# Patient Record
Sex: Female | Born: 1985 | Race: White | Hispanic: No | Marital: Married | Smoking: Former smoker
Health system: Southern US, Community
[De-identification: ages and names within clinical notes are randomized; demographics above are authoritative.]

## PROBLEM LIST (undated history)

## (undated) DIAGNOSIS — R7303 Prediabetes: Secondary | ICD-10-CM

## (undated) DIAGNOSIS — F419 Anxiety disorder, unspecified: Secondary | ICD-10-CM

## (undated) DIAGNOSIS — G43909 Migraine, unspecified, not intractable, without status migrainosus: Secondary | ICD-10-CM

## (undated) HISTORY — PX: WISDOM TOOTH EXTRACTION: SHX21

## (undated) HISTORY — PX: ABDOMINAL SURGERY: SHX537

---

## 2013-09-06 ENCOUNTER — Emergency Department (HOSPITAL_COMMUNITY)
Admission: EM | Admit: 2013-09-06 | Discharge: 2013-09-06 | Disposition: A | Discharge status: Home / Self Care | Payer: Self-pay | Attending: Emergency Medicine | Admitting: Emergency Medicine

## 2013-09-06 ENCOUNTER — Emergency Department (HOSPITAL_COMMUNITY): Payer: Self-pay

## 2013-09-06 ENCOUNTER — Encounter (HOSPITAL_COMMUNITY): Payer: Self-pay | Admitting: Emergency Medicine

## 2013-09-06 DIAGNOSIS — R1032 Left lower quadrant pain: Secondary | ICD-10-CM | POA: Insufficient documentation

## 2013-09-06 DIAGNOSIS — R1031 Right lower quadrant pain: Secondary | ICD-10-CM | POA: Insufficient documentation

## 2013-09-06 DIAGNOSIS — R197 Diarrhea, unspecified: Secondary | ICD-10-CM | POA: Insufficient documentation

## 2013-09-06 DIAGNOSIS — R509 Fever, unspecified: Secondary | ICD-10-CM | POA: Insufficient documentation

## 2013-09-06 DIAGNOSIS — F17200 Nicotine dependence, unspecified, uncomplicated: Secondary | ICD-10-CM | POA: Insufficient documentation

## 2013-09-06 DIAGNOSIS — Z9889 Other specified postprocedural states: Secondary | ICD-10-CM | POA: Insufficient documentation

## 2013-09-06 DIAGNOSIS — N72 Inflammatory disease of cervix uteri: Secondary | ICD-10-CM | POA: Insufficient documentation

## 2013-09-06 DIAGNOSIS — Z8679 Personal history of other diseases of the circulatory system: Secondary | ICD-10-CM | POA: Insufficient documentation

## 2013-09-06 DIAGNOSIS — E669 Obesity, unspecified: Secondary | ICD-10-CM | POA: Insufficient documentation

## 2013-09-06 DIAGNOSIS — R630 Anorexia: Secondary | ICD-10-CM | POA: Insufficient documentation

## 2013-09-06 HISTORY — DX: Migraine, unspecified, not intractable, without status migrainosus: G43.909

## 2013-09-06 LAB — WET PREP, GENITAL
TRICH WET PREP: NONE SEEN
YEAST WET PREP: NONE SEEN

## 2013-09-06 LAB — COMPREHENSIVE METABOLIC PANEL
ALBUMIN: 4 g/dL (ref 3.5–5.2)
ALT: 19 U/L (ref 0–35)
ANION GAP: 13 (ref 5–15)
AST: 23 U/L (ref 0–37)
Alkaline Phosphatase: 67 U/L (ref 39–117)
BUN: 13 mg/dL (ref 6–23)
CALCIUM: 10 mg/dL (ref 8.4–10.5)
CO2: 22 mEq/L (ref 19–32)
CREATININE: 1 mg/dL (ref 0.50–1.10)
Chloride: 103 mEq/L (ref 96–112)
GFR calc Af Amer: 87 mL/min — ABNORMAL LOW (ref >90)
GFR calc non Af Amer: 75 mL/min — ABNORMAL LOW (ref >90)
Glucose, Bld: 91 mg/dL (ref 70–99)
Potassium: 4.3 mEq/L (ref 3.7–5.3)
Sodium: 138 mEq/L (ref 137–147)
TOTAL PROTEIN: 7.6 g/dL (ref 6.0–8.3)
Total Bilirubin: <0.2 mg/dL — ABNORMAL LOW (ref 0.3–1.2)

## 2013-09-06 LAB — CBC WITH DIFFERENTIAL/PLATELET
BASOS ABS: 0 10*3/uL (ref 0.0–0.1)
BASOS PCT: 0 % (ref 0–1)
EOS ABS: 0 10*3/uL (ref 0.0–0.7)
EOS PCT: 0 % (ref 0–5)
HEMATOCRIT: 39.7 % (ref 36.0–46.0)
HEMOGLOBIN: 13.3 g/dL (ref 12.0–15.0)
Lymphocytes Relative: 14 % (ref 12–46)
Lymphs Abs: 1 10*3/uL (ref 0.7–4.0)
MCH: 29.1 pg (ref 26.0–34.0)
MCHC: 33.5 g/dL (ref 30.0–36.0)
MCV: 86.9 fL (ref 78.0–100.0)
MONO ABS: 0.6 10*3/uL (ref 0.1–1.0)
MONOS PCT: 8 % (ref 3–12)
Neutro Abs: 5.4 10*3/uL (ref 1.7–7.7)
Neutrophils Relative %: 78 % — ABNORMAL HIGH (ref 43–77)
Platelets: 269 10*3/uL (ref 150–400)
RBC: 4.57 MIL/uL (ref 3.87–5.11)
RDW: 12.4 % (ref 11.5–15.5)
WBC: 7 10*3/uL (ref 4.0–10.5)

## 2013-09-06 LAB — URINALYSIS, ROUTINE W REFLEX MICROSCOPIC
BILIRUBIN URINE: NEGATIVE
Glucose, UA: NEGATIVE mg/dL
Hgb urine dipstick: NEGATIVE
Ketones, ur: NEGATIVE mg/dL
Leukocytes, UA: NEGATIVE
NITRITE: NEGATIVE
PH: 5.5 (ref 5.0–8.0)
Protein, ur: NEGATIVE mg/dL
SPECIFIC GRAVITY, URINE: 1.009 (ref 1.005–1.030)
UROBILINOGEN UA: 0.2 mg/dL (ref 0.0–1.0)

## 2013-09-06 LAB — POC URINE PREG, ED: Preg Test, Ur: NEGATIVE

## 2013-09-06 LAB — LIPASE, BLOOD: Lipase: 40 U/L (ref 11–59)

## 2013-09-06 MED ORDER — CEFTRIAXONE SODIUM 250 MG IJ SOLR
250.0000 mg | Freq: Once | INTRAMUSCULAR | Status: AC
  Administered 2013-09-06: 250 mg via INTRAMUSCULAR
  Filled 2013-09-06: qty 250

## 2013-09-06 MED ORDER — STERILE WATER FOR INJECTION IJ SOLN
INTRAMUSCULAR | Status: AC
  Administered 2013-09-06: 20:00:00
  Filled 2013-09-06: qty 10

## 2013-09-06 MED ORDER — TRAMADOL HCL 50 MG PO TABS
50.0000 mg | ORAL_TABLET | Freq: Once | ORAL | Status: AC
  Administered 2013-09-06: 50 mg via ORAL
  Filled 2013-09-06: qty 1

## 2013-09-06 MED ORDER — FENTANYL CITRATE 0.05 MG/ML IJ SOLN
100.0000 ug | Freq: Once | INTRAMUSCULAR | Status: AC
  Administered 2013-09-06: 100 ug via INTRAVENOUS
  Filled 2013-09-06: qty 2

## 2013-09-06 MED ORDER — METRONIDAZOLE 500 MG PO TABS: 500.0000 mg | ORAL_TABLET | Freq: Two times a day (BID) | ORAL | Status: DC

## 2013-09-06 MED ORDER — TRAMADOL HCL 50 MG PO TABS: 50.0000 mg | ORAL_TABLET | Freq: Four times a day (QID) | ORAL | Status: DC | PRN

## 2013-09-06 MED ORDER — SODIUM CHLORIDE 0.9 % IV BOLUS (SEPSIS)
1000.0000 mL | Freq: Once | INTRAVENOUS | Status: AC
  Administered 2013-09-06: 1000 mL via INTRAVENOUS

## 2013-09-06 MED ORDER — AZITHROMYCIN 250 MG PO TABS
1000.0000 mg | ORAL_TABLET | Freq: Once | ORAL | Status: AC
  Administered 2013-09-06: 1000 mg via ORAL
  Filled 2013-09-06: qty 4

## 2013-09-06 NOTE — ED Notes (Signed)
Pt continues to be monitored by blood pressure, pulse ox, and 5 lead. Pelvic cart set up at bedside. Pt is undressed from waist down awaiting exam.

## 2013-09-06 NOTE — Discharge Instructions (Signed)
Cervicitis Call the [facility] to get a gynecologist and to be rechecked if you're not feeling better by next week. Call the wellness Center or any of the numbers on the resource guide to get a primary care physician Cervicitis is a soreness and swelling (inflammation) of the cervix. Your cervix is located at the bottom of your uterus. It opens up to the vagina. CAUSES   Sexually transmitted infections (STIs).   Allergic reaction.   Medicines or birth control devices that are put in the vagina.   Injury to the cervix.   Bacterial infections.  RISK FACTORS You are at greater risk if you:  Have unprotected sexual intercourse.  Have sexual intercourse with many partners.  Began sexual intercourse at an early age.  Have a history of STIs. SYMPTOMS  There may be no symptoms. If symptoms occur, they may include:   Gray, white, yellow, or bad-smelling vaginal discharge.   Pain or itching of the area outside the vagina.   Painful sexual intercourse.   Lower abdominal or lower back pain, especially during intercourse.   Frequent urination.   Abnormal vaginal bleeding between periods, after sexual intercourse, or after menopause.   Pressure or a heavy feeling in the pelvis.  DIAGNOSIS  Diagnosis is made after a pelvic exam. Other tests may include:   Examination of any discharge under a microscope (wet prep).   A Pap test.  TREATMENT  Treatment will depend on the cause of cervicitis. If it is caused by an STI, both you and your partner will need to be treated. Antibiotic medicines will be given.  HOME CARE INSTRUCTIONS   Do not have sexual intercourse until your health care provider says it is okay.   Do not have sexual intercourse until your partner has been treated, if your cervicitis is caused by an STI.   Take your antibiotics as directed. Finish them even if you start to feel better.  SEEK MEDICAL CARE IF:  Your symptoms come back.    You have a fever.  MAKE SURE YOU:   Understand these instructions.  Will watch your condition.  Will get help right away if you are not doing well or get worse. Document Released: 01/10/2005 Document Revised: 01/15/2013 Document Reviewed: 07/04/2012 [facility] Patient Information 2015 [place], [place]. This information is not intended to replace advice given to you by your health care provider. Make sure you discuss any questions you have with your health care provider.  Emergency Department Resource Guide 1) Find a Doctor and Pay Out of Pocket Although you won't have to find out who is covered by your insurance plan, it is a good idea to ask around and get recommendations. You will then need to call the office and see if the doctor you have chosen will accept you as a new patient and what types of options they offer for patients who are self-pay. Some doctors offer discounts or will set up payment plans for their patients who do not have insurance, but you will need to ask so you aren't surprised when you get to your appointment.  2) Contact Your Local Health Department Not all health departments have doctors that can see patients for sick visits, but many do, so it is worth a call to see if yours does. If you don't know where your local health department is, you can check in your phone book. The CDC also has a tool to help you locate your state's health department, and many state websites  also have listings of all of their local health departments.  3) Find a [facility] [place] [facility] If your illness is not likely to be very severe or complicated, you may want to try a walk in clinic. These are popping up all over the country in pharmacies, drugstores, and shopping centers. They're usually staffed by nurse practitioners or physician assistants that have been trained to treat common illnesses and complaints. They're usually fairly quick and inexpensive. However, if you have serious medical issues or  chronic medical problems, these are probably not your best option.  No Primary Care Doctor: - Call Health Connect at  [phone] - they can help you locate a primary care doctor that  accepts your insurance, provides certain services, etc. - Physician Referral Service- [phone]  Chronic Pain Problems: Organization         Address  Phone   Notes  [facility]  [phone] Patients need to be referred by their primary care doctor.   Medication Assistance: Organization         Address  Phone   Notes  [facility] Medication [facility] [address] [place], [place] [address] [phone] --Must be a resident of [facility] -- Must have NO insurance coverage whatsoever (no Medicaid/ Medicare, etc.) -- The pt. MUST have a primary care doctor that directs their care regularly and follows them in the community   MedAssist  [phone]   [employer]  [phone]    Agencies that provide inexpensive medical care: Organization         Address  Phone   Notes  [place]  [phone]   [facility]    [phone]   [facility] [place], [place] [address] [phone]   [place] [address] [place] [phone]   Planned Parenthood    [phone]   [facility]    [phone]   [place] and [place] [address], [place] Phone:  [phone], Fax:  [phone] Hours of Operation:  9 am - 6 pm, M-F.  Also accepts Medicaid/Medicare and self-pay.  [facility] [place] for [place], [address], [place] Phone: [phone], Fax: [phone]. Hours of Operation:  8:30 am - 5:30 pm, M-F.  Also accepts Medicaid and self-pay.  [facility] [place] [address] [place] Phone: [phone]   [place], [place], [place]  [phone], Ext. [phone] Mondays & Thursdays: 7-9 AM.  First 15 patients are seen on a first come, first serve basis.    [place] Providers:  Organization         Address  Phone   Notes  [facility] [address], [place] [phone] Also accepts self-pay patients.  [facility] [address], [place]  [phone]   [place], [address], [place] [phone]   [facility] [address] [place] [phone]   Lucianne Lei [address], [place]   [phone] Only accepts [place] Access [place] patients after they have their name applied to their card.   Self-Pay (no insurance) in [facility]:  [employer]  Notes  Sickle Cell Patients, [place] [phone]   [facility] Urgent Care [place] [phone]   [facility] [place]  [place], [place], [place] [phone]   [facility]/Dr. Osei-Bonsu  [address], [place] or [address], [place] [phone] Phone number for both [place] and [place] locations is the same.  Urgent Medical and [facility] [address], [place] [phone]   [facility] [address] [place] or [address] [phone]   [facility] [address] [place] [phone], phone; [phone], fax Sees patients 1st and 3rd Saturday of every month.  Must not qualify for public or private insurance (i.e. Medicaid, Medicare, [place] Health Choice, Veterans' Benefits)  Household income should be no more than 200% of the poverty level The clinic cannot treat you if you are pregnant or think you are pregnant  Sexually transmitted  diseases are not treated at the clinic.    Dental Care: Organization         Address  Phone  Notes  [facility] Department of [facility] [place] [phone] Accepts children up to age 15 who are enrolled in [place] or [place]; pregnant women with a Medicaid card; and children who have applied for Medicaid or [place] Health Choice, but were declined, whose parents can pay a reduced fee at time of service.  [facility] Department of [facility]  [address], [place] [phone] Accepts children up to age 36 who are enrolled in [place] or [place]; pregnant women with a Medicaid card; and children who have applied for Medicaid or [place] Health Choice, but were declined, whose parents can pay a reduced fee at time of service.  [facility]  [place] [phone] Patients are seen by appointment only. Walk-ins are not accepted. [place] will see patients 55 years of age and older. Monday - Tuesday (8am-5pm) Most Wednesdays (8:30-5pm) $30 per visit, cash only  [facility]  [address], [facility] [phone] Patients are seen by appointment only. Walk-ins are not accepted. [place] will see patients 37 years of age and older. One Wednesday Evening (Monthly: Volunteer Based).  $30 per visit, cash only  [place]  [phone] for adults; Children under age 70, call Graduate Pediatric Dentistry at [phone]. Children aged 67-14, please call [phone] to request a pediatric application.  Dental services are provided in all areas of dental care including fillings, crowns and bridges, complete and partial dentures, implants, gum treatment, root canals, and extractions. Preventive care is also provided. Treatment is provided to both adults and children. Patients are selected via a  lottery and there is often a waiting list.   [facility] [address] [place]  [phone] [email or web address]   [facility] [address] [place], [place] [phone], Ext. [phone] Second and Fourth Thursday of each month, opens at 6:30 AM; Clinic ends at 9 AM.  Patients are seen on a first-come first-served basis, and a limited number are seen during each clinic.   [facility]  [address] Hillard Danker [place], [place] [phone]  Eligibility Requirements You must have lived in [place], [place], or [place] counties for at least the last three months.   You cannot be eligible for state or federal sponsored [employer], including [employer], [place], or [employer].   You generally cannot be eligible for healthcare insurance through your employer.    How to apply: Eligibility screenings are held every Tuesday and Wednesday afternoon from 1:00 pm until 4:00 pm. You do not need an appointment for the interview!  [facility] [address] [place], [place]   [place]  [place] Department  [place]  [phone]    Behavioral Health Resources in the Community: Intensive Outpatient Programs Organization         Address  Phone  Notes  [place]. [address] [place], [place] [phone]   [facility] Outpatient [address], [place], [place]   ADS: Alcohol & Drug Svcs [address] [place], [place]   [place] [address],  [place], [place] or [phone]   Substance Abuse Resources Organization         Address  Phone  Notes  Alcohol and Drug Services  [phone]   [place]  [phone]   The [place]   Chinita Pester  [phone]   Residential &  Outpatient Substance Abuse Program  [phone]   Psychological Services Organization         Address  Phone  Notes  [facility]  [place]  [phone]   [place] [address], [place] or [phone]    Mobile Crisis Teams Organization         Address  Phone  Notes  [facility], Mobile Crisis Care Unit  [phone]   [facility] Services  [address] [place], [place]   [place] [address] [place] [phone]    Self-Help/Support Groups Organization         Address  Phone             Notes  [place]. of [place] - variety of support groups  [place] Call for more information  Narcotics Anonymous (NA), [facility]aring [facility] [address], [employer] [place]  2 meetings at this location   Special educational needs teacher         Address  Phone  Notes  [facility] Residential Treatment [place],    [place]  [phone]   [facility]  [address], [place] [phone], [place], [place]   [place], [place] [phone] Admissions: 8am-3pm M-F  Incentives Substance [place] [address]    [place], [place] [phone]   [facility] [address] Jadene Pierini [place], [place]   [facility] [address]  [place], [place]   [facility]ight Programs - Intensive Outpatient [address], [place], [place]   [facility] ([place].) [place].,  [place], [place] or [phone]   [facility] Services (RTS) [address], [place], [place] Accepts Medicaid  [facility] [place] [address]  [place] [phone] Substance Abuse/Addiction Treatment   [facility] Resources Organization  Address  Phone  Notes  [facility]  [phone]   Domenic Schwab, PhD [address] Arlis Porta [place], [place]   [phone] or [phone]   [place], [place] [phone]   [address], [place], [place] [phone] Insurance/Medicaid/sponsorship through [facility] and Families [address], [address] [place]                                    [place], [place] [phone] [place] [address], [place] [phone]    Dr. Adele Schilder  [phone]   [facility] [place] Dept. 1) [address] [place] 2) [place] 3)  [place] [address], [place] [phone]  [phone]   [place] [phone] or [phone] (After Hours)

## 2013-09-06 NOTE — ED Notes (Signed)
Pt reports thinks she was pregnant and then was going to take a preg test but started to bleed; has had previous miscarriage, now this morning- severe lower back and abd pain, 100.6 temp at home, diarrhea, then started to have brown bleeding

## 2013-09-06 NOTE — ED Provider Notes (Signed)
CSN: [id number]     Arrival date & time 09/06/13  1433 History   First MD Initiated Contact with Patient 09/06/13 1626     Chief Complaint  Patient presents with  . Abdominal Pain     (Consider location/radiation/quality/duration/timing/severity/associated sxs/prior Treatment) HPI Complains of low crampy abdominal pain onset approximately 5 days ago. She developed copious vaginal bleeding onset 5 days ago which has since slowed down. Her normal menstrual cycle runs approximately every 2.5 months. She denies nausea. Maximum temperature was 100.6 today. She has had approximately 5 episodes of nonbloody diarrhea today. Pain is worse with movement improved after treating herself with Benadryl. Last treated last night. Other associated symptoms include diminished appetite. She denies urinary symptoms. She admits to brownish discharge since vaginal bleeding has slowed. Treatment with Benadryl. No other treatment prior to coming here. Pain is much improved over the past 24 hours. Patient thought she might be pregnant Past Medical History  Diagnosis Date  . Migraine    Past Surgical History  Procedure Laterality Date  . Abdominal surgery      abd mass removed   History reviewed. No pertinent family history. History  Substance Use Topics  . Smoking status: Current Every Day Smoker -- 0.25 packs/day    Types: Cigarettes  . Smokeless tobacco: Not on file  . Alcohol Use: No   OB History   Grav Para Term Preterm Abortions TAB SAB Ect Mult Living                 Review of Systems  Constitutional: Positive for fever and appetite change.  HENT: Negative.   Respiratory: Negative.   Cardiovascular: Negative.   Gastrointestinal: Positive for abdominal pain and diarrhea.  Genitourinary: Positive for vaginal bleeding and vaginal discharge.  Musculoskeletal: Negative.   Skin: Negative.   Neurological: Negative.   Psychiatric/Behavioral: Negative.   All other systems reviewed and are  negative.     Allergies  Other and Morphine and related  Home Medications   Prior to Admission medications   Not on File   BP 130/96  Pulse 108  Temp(Src) 99 F (37.2 C) (Oral)  Resp 18  Ht 5' 10.5" (1.791 m)  Wt 210 lb (95.255 kg)  BMI 29.70 kg/m2  SpO2 97%  LMP 07/02/2013 Physical Exam  Nursing note and vitals reviewed. Constitutional: She appears well-developed and well-nourished. No distress.  HENT:  Head: Normocephalic and atraumatic.  Eyes: Conjunctivae are normal. Pupils are equal, round, and reactive to light.  Neck: Neck supple. No tracheal deviation present. No thyromegaly present.  Cardiovascular: Normal rate and regular rhythm.   No murmur heard. Pulmonary/Chest: Effort normal and breath sounds normal.  Abdominal: Soft. Bowel sounds are normal. She exhibits no distension. There is no tenderness.  Obese. Mild diffuse  tenderness over bilateral lower quadrants  Genitourinary:  No external lesion. Yellowish vaginal discharge. Cervical os closed. Positive cervical motion tenderness positive bilateral adnexal tenderness no masses  Musculoskeletal: Normal range of motion. She exhibits no edema and no tenderness.  Neurological: She is alert. Coordination normal.  Skin: Skin is warm and dry. No rash noted.  Psychiatric: She has a normal mood and affect.    ED Course  Procedures (including critical care time) Labs Review Labs Reviewed  CBC WITH DIFFERENTIAL - Abnormal; Notable for the following:    Neutrophils Relative % 78 (*)    All other components within normal limits  COMPREHENSIVE METABOLIC PANEL - Abnormal; Notable for the following:    Total  Bilirubin <0.2 (*)    GFR calc non Af Amer 75 (*)    GFR calc Af Amer 87 (*)    All other components within normal limits  GC/CHLAMYDIA PROBE AMP  WET PREP, GENITAL  LIPASE, BLOOD  URINALYSIS, ROUTINE W REFLEX MICROSCOPIC  RPR  HIV ANTIBODY (ROUTINE TESTING)  POC URINE PREG, ED   Results for orders placed  during the hospital encounter of 09/06/13  WET PREP, GENITAL      Result Value Ref Range   Yeast Wet Prep HPF POC NONE SEEN  NONE SEEN   Trich, Wet Prep NONE SEEN  NONE SEEN   Clue Cells Wet Prep HPF POC MODERATE (*) NONE SEEN   WBC, Wet Prep HPF POC FEW (*) NONE SEEN  CBC WITH DIFFERENTIAL      Result Value Ref Range   WBC 7.0  4.0 - 10.5 K/uL   RBC 4.57  3.87 - 5.11 MIL/uL   Hemoglobin 13.3  12.0 - 15.0 g/dL   HCT 39.7  36.0 - 46.0 %   MCV 86.9  78.0 - 100.0 fL   MCH 29.1  26.0 - 34.0 pg   MCHC 33.5  30.0 - 36.0 g/dL   RDW 12.4  11.5 - 15.5 %   Platelets 269  150 - 400 K/uL   Neutrophils Relative % 78 (*) 43 - 77 %   Neutro Abs 5.4  1.7 - 7.7 K/uL   Lymphocytes Relative 14  12 - 46 %   Lymphs Abs 1.0  0.7 - 4.0 K/uL   Monocytes Relative 8  3 - 12 %   Monocytes Absolute 0.6  0.1 - 1.0 K/uL   Eosinophils Relative 0  0 - 5 %   Eosinophils Absolute 0.0  0.0 - 0.7 K/uL   Basophils Relative 0  0 - 1 %   Basophils Absolute 0.0  0.0 - 0.1 K/uL  COMPREHENSIVE METABOLIC PANEL      Result Value Ref Range   Sodium 138  137 - 147 mEq/L   Potassium 4.3  3.7 - 5.3 mEq/L   Chloride 103  96 - 112 mEq/L   CO2 22  19 - 32 mEq/L   Glucose, Bld 91  70 - 99 mg/dL   BUN 13  6 - 23 mg/dL   Creatinine, Ser 1.00  0.50 - 1.10 mg/dL   Calcium 10.0  8.4 - 10.5 mg/dL   Total Protein 7.6  6.0 - 8.3 g/dL   Albumin 4.0  3.5 - 5.2 g/dL   AST 23  0 - 37 U/L   ALT 19  0 - 35 U/L   Alkaline Phosphatase 67  39 - 117 U/L   Total Bilirubin <0.2 (*) 0.3 - 1.2 mg/dL   GFR calc non Af Amer 75 (*) >90 mL/min   GFR calc Af Amer 87 (*) >90 mL/min   Anion gap 13  5 - 15  LIPASE, BLOOD      Result Value Ref Range   Lipase 40  11 - 59 U/L  URINALYSIS, ROUTINE W REFLEX MICROSCOPIC      Result Value Ref Range   Color, Urine YELLOW  YELLOW   APPearance CLEAR  CLEAR   Specific Gravity, Urine 1.009  1.005 - 1.030   pH 5.5  5.0 - 8.0   Glucose, UA NEGATIVE  NEGATIVE mg/dL   Hgb urine dipstick NEGATIVE  NEGATIVE    Bilirubin Urine NEGATIVE  NEGATIVE   Ketones, ur NEGATIVE  NEGATIVE mg/dL  Protein, ur NEGATIVE  NEGATIVE mg/dL   Urobilinogen, UA 0.2  0.0 - 1.0 mg/dL   Nitrite NEGATIVE  NEGATIVE   Leukocytes, UA NEGATIVE  NEGATIVE  POC URINE PREG, ED      Result Value Ref Range   Preg Test, Ur NEGATIVE  NEGATIVE   US Transvaginal Non-ob  09/06/2013   CLINICAL DATA:  Pelvic pain and bilateral adnexal tenderness.  EXAM: TRANSABDOMINAL AND TRANSVAGINAL ULTRASOUND OF PELVIS  TECHNIQUE: Both transabdominal and transvaginal ultrasound examinations of the pelvis were performed. Transabdominal technique was performed for global imaging of the pelvis including uterus, ovaries, adnexal regions, and pelvic cul-de-sac. It was necessary to proceed with endovaginal exam following the transabdominal exam to visualize the uterus, endometrium, ovaries and adnexal regions.  COMPARISON:  None.  FINDINGS: Uterus  Measurements: 5.4 x 2.9 x 3.7 cm. No fibroids or other mass visualized.  Endometrium  Thickness: 3 mm.  No focal abnormality visualized.  Right ovary  Measurements: 4.0 x 2.9 x 3.6 cm. Probable hemorrhagic follicle measures 1.9 cm.  Left ovary  Measurements: 3.5 x 2.4 x 2.7 cm. Normal appearance/no adnexal mass.  Other findings  No free fluid.  IMPRESSION: No acute findings to explain the patient's pain.   Electronically Signed   By: Lorin Picket M.D.   On: 09/06/2013 18:41   US Pelvis Complete  09/06/2013   CLINICAL DATA:  Pelvic pain and bilateral adnexal tenderness.  EXAM: TRANSABDOMINAL AND TRANSVAGINAL ULTRASOUND OF PELVIS  TECHNIQUE: Both transabdominal and transvaginal ultrasound examinations of the pelvis were performed. Transabdominal technique was performed for global imaging of the pelvis including uterus, ovaries, adnexal regions, and pelvic cul-de-sac. It was necessary to proceed with endovaginal exam following the transabdominal exam to visualize the uterus, endometrium, ovaries and adnexal regions.   COMPARISON:  None.  FINDINGS: Uterus  Measurements: 5.4 x 2.9 x 3.7 cm. No fibroids or other mass visualized.  Endometrium  Thickness: 3 mm.  No focal abnormality visualized.  Right ovary  Measurements: 4.0 x 2.9 x 3.6 cm. Probable hemorrhagic follicle measures 1.9 cm.  Left ovary  Measurements: 3.5 x 2.4 x 2.7 cm. Normal appearance/no adnexal mass.  Other findings  No free fluid.  IMPRESSION: No acute findings to explain the patient's pain.   Electronically Signed   By: Lorin Picket M.D.   On: 09/06/2013 18:41   Dg Abd Acute W/chest  09/06/2013   CLINICAL DATA:  Chronic abdominal pain.  Nausea.  EXAM: ACUTE ABDOMEN SERIES (ABDOMEN 2 VIEW & CHEST 1 VIEW)  COMPARISON:  None.  FINDINGS: There is no evidence of dilated bowel loops or free intraperitoneal air. No radiopaque calculi or other significant radiographic abnormality is seen. Heart size and mediastinal contours are within normal limits. Both lungs are clear.  IMPRESSION: Negative abdominal radiographs.  No acute cardiopulmonary disease.   Electronically Signed   By: Rolm Baptise M.D.   On: 09/06/2013 19:00     Imaging Review No results found.   EKG Interpretation None     7:25 PM pain had improved transiently after treatment with intravenous fentanyl however pain was returning. She questioned tramadol which was ordered by me  MDM  Pelvic exam is consistent with cervicitis Final diagnoses:  None   patient has vaginitis on wet prep. Patient treated empirically for STDs. Cultures pending. Plan prescription Flagyl, tramadol. Referral women's health clinic Resource guide to get a primary care physician Diagnosis #1 cervicitis #2 low abdominal pain #3 vaginitis    Orlie Dakin, MD 09/06/13  2036 

## 2013-09-07 LAB — RPR

## 2013-09-07 LAB — GC/CHLAMYDIA PROBE AMP
CT PROBE, AMP APTIMA: NEGATIVE
GC Probe RNA: NEGATIVE

## 2013-09-07 LAB — HIV ANTIBODY (ROUTINE TESTING W REFLEX): HIV 1&2 Ab, 4th Generation: NONREACTIVE

## 2013-11-12 ENCOUNTER — Encounter (HOSPITAL_COMMUNITY): Payer: Self-pay | Admitting: Emergency Medicine

## 2013-11-12 ENCOUNTER — Emergency Department (HOSPITAL_COMMUNITY)
Admission: EM | Admit: 2013-11-12 | Discharge: 2013-11-12 | Discharge status: Against Medical Advice | Payer: Self-pay | Attending: Emergency Medicine | Admitting: Emergency Medicine

## 2013-11-12 DIAGNOSIS — R51 Headache: Secondary | ICD-10-CM | POA: Insufficient documentation

## 2013-11-12 DIAGNOSIS — R109 Unspecified abdominal pain: Secondary | ICD-10-CM | POA: Insufficient documentation

## 2013-11-12 DIAGNOSIS — Z720 Tobacco use: Secondary | ICD-10-CM | POA: Insufficient documentation

## 2013-11-12 LAB — POC URINE PREG, ED: Preg Test, Ur: NEGATIVE

## 2013-11-12 LAB — CBC WITH DIFFERENTIAL/PLATELET
BASOS ABS: 0 10*3/uL (ref 0.0–0.1)
Basophils Relative: 0 % (ref 0–1)
Eosinophils Absolute: 0.1 10*3/uL (ref 0.0–0.7)
Eosinophils Relative: 1 % (ref 0–5)
HEMATOCRIT: 42.1 % (ref 36.0–46.0)
Hemoglobin: 14.2 g/dL (ref 12.0–15.0)
LYMPHS PCT: 25 % (ref 12–46)
Lymphs Abs: 2.1 10*3/uL (ref 0.7–4.0)
MCH: 29.6 pg (ref 26.0–34.0)
MCHC: 33.7 g/dL (ref 30.0–36.0)
MCV: 87.7 fL (ref 78.0–100.0)
MONO ABS: 0.5 10*3/uL (ref 0.1–1.0)
Monocytes Relative: 6 % (ref 3–12)
NEUTROS ABS: 5.6 10*3/uL (ref 1.7–7.7)
Neutrophils Relative %: 68 % (ref 43–77)
Platelets: 329 10*3/uL (ref 150–400)
RBC: 4.8 MIL/uL (ref 3.87–5.11)
RDW: 13 % (ref 11.5–15.5)
WBC: 8.3 10*3/uL (ref 4.0–10.5)

## 2013-11-12 LAB — COMPREHENSIVE METABOLIC PANEL
ALT: 21 U/L (ref 0–35)
AST: 20 U/L (ref 0–37)
Albumin: 4.1 g/dL (ref 3.5–5.2)
Alkaline Phosphatase: 57 U/L (ref 39–117)
Anion gap: 12 (ref 5–15)
BUN: 17 mg/dL (ref 6–23)
CHLORIDE: 105 meq/L (ref 96–112)
CO2: 23 mEq/L (ref 19–32)
CREATININE: 1.07 mg/dL (ref 0.50–1.10)
Calcium: 9.7 mg/dL (ref 8.4–10.5)
GFR calc non Af Amer: 69 mL/min — ABNORMAL LOW (ref >90)
GFR, EST AFRICAN AMERICAN: 80 mL/min — AB (ref >90)
Glucose, Bld: 85 mg/dL (ref 70–99)
Potassium: 4.7 mEq/L (ref 3.7–5.3)
SODIUM: 140 meq/L (ref 137–147)
Total Protein: 8 g/dL (ref 6.0–8.3)

## 2013-11-12 LAB — URINALYSIS, ROUTINE W REFLEX MICROSCOPIC
Bilirubin Urine: NEGATIVE
GLUCOSE, UA: NEGATIVE mg/dL
HGB URINE DIPSTICK: NEGATIVE
Ketones, ur: NEGATIVE mg/dL
Leukocytes, UA: NEGATIVE
Nitrite: NEGATIVE
Protein, ur: NEGATIVE mg/dL
SPECIFIC GRAVITY, URINE: 1.032 — AB (ref 1.005–1.030)
Urobilinogen, UA: 0.2 mg/dL (ref 0.0–1.0)
pH: 5 (ref 5.0–8.0)

## 2013-11-12 LAB — LIPASE, BLOOD: Lipase: 47 U/L (ref 11–59)

## 2013-11-12 NOTE — ED Notes (Signed)
Pt reports generalized abdominal pain with nausea, "always feeling hungry when I am not", bloating, HA and nausea x 3 weeks. States she believes that she was exposed to black mold. Pt denies fever/chills. Denies CP. Pt in NAD.

## 2015-09-12 DIAGNOSIS — N946 Dysmenorrhea, unspecified: Secondary | ICD-10-CM | POA: Diagnosis present

## 2016-07-28 ENCOUNTER — Other Ambulatory Visit: Payer: Self-pay | Admitting: Neurology

## 2016-07-28 DIAGNOSIS — G43119 Migraine with aura, intractable, without status migrainosus: Secondary | ICD-10-CM

## 2016-08-03 ENCOUNTER — Ambulatory Visit
Admission: RE | Admit: 2016-08-03 | Discharge: 2016-08-03 | Disposition: A | Discharge status: Home / Self Care | Payer: BLUE CROSS/BLUE SHIELD | Source: Ambulatory Visit | Attending: Neurology | Admitting: Neurology

## 2016-08-03 DIAGNOSIS — G43119 Migraine with aura, intractable, without status migrainosus: Secondary | ICD-10-CM | POA: Insufficient documentation

## 2016-08-03 MED ORDER — GADOBENATE DIMEGLUMINE 529 MG/ML IV SOLN
20.0000 mL | Freq: Once | INTRAVENOUS | Status: AC | PRN
  Administered 2016-08-03: 20 mL via INTRAVENOUS

## 2019-06-20 ENCOUNTER — Encounter: Payer: Self-pay | Admitting: Dermatology

## 2019-06-20 ENCOUNTER — Other Ambulatory Visit: Payer: Self-pay

## 2019-06-20 ENCOUNTER — Ambulatory Visit: Payer: BC Managed Care – PPO | Admitting: Dermatology

## 2019-06-20 DIAGNOSIS — D485 Neoplasm of uncertain behavior of skin: Secondary | ICD-10-CM | POA: Diagnosis not present

## 2019-06-20 DIAGNOSIS — B078 Other viral warts: Secondary | ICD-10-CM

## 2019-06-20 DIAGNOSIS — D1730 Benign lipomatous neoplasm of skin and subcutaneous tissue of unspecified sites: Secondary | ICD-10-CM

## 2019-06-20 DIAGNOSIS — D492 Neoplasm of unspecified behavior of bone, soft tissue, and skin: Secondary | ICD-10-CM

## 2019-06-20 NOTE — Patient Instructions (Addendum)
Viral Warts & Molluscum Contagiosum  Viral warts and molluscum contagiosum are growths of the skin caused by viral infection of the skin. If you have been given the diagnosis of viral warts or molluscum contagiosum there are a few things that you must understand about your condition:  1. There is no guaranteed treatment method available for this condition. 2. Multiple treatments may be required, 3. The treatments may be time consuming and require multiple visits to the dermatology office. 4. The treatment may be expensive. You will be charged each time you come into the office to have the spots treated. 5. The treated areas may develop new lesions further complicating treatment. 6. The treated areas may leave a scar. 7. There is no guarantee that even after multiple treatments that the spots will be successfully treated. 8. These are caused by a viral infection and can be spread to other areas of the skin and to other people by direct contact. Therefore, new spots may occur.   Pre-Operative Instructions  You are scheduled for a surgical procedure at [facility]. We recommend you read the following instructions. If you have any questions or concerns, please call the office at [phone].  1. Shower and wash the entire body with soap and water the day of your surgery paying special attention to cleansing at and around the planned surgery site.  2. Avoid aspirin or aspirin containing products at least fourteen (14) days prior to your surgical procedure and for at least one week (7 Days) after your surgical procedure. If you take aspirin on a regular basis for heart disease or history of stroke or for any other reason, we may recommend you continue taking aspirin but please notify us if you take this on a regular basis. Aspirin can cause more bleeding to occur during surgery as well as prolonged bleeding and bruising after surgery.   3. Avoid other nonsteroidal pain medications at least  one week prior to surgery and at least one week prior to your surgery. These include medications such as Ibuprofen (Motrin, Advil and Nuprin), Naprosyn, Voltaren, Relafen, etc. If medications are used for therapeutic reasons, please inform us as they can cause increased bleeding or prolonged bleeding during and bruising after surgical procedures.   4. Please advice [place] if you are taking any "blood thinner" medications such as Coumadin or Dipyridamole or Plavix or similar medications. These cause increased bleeding and prolonged bleeding during and bruising after surgical procedures. We may have to consider discontinuing these medications briefly prior to and shortly after your surgery, if safe to do so.   5. Please inform us of all medications you are currently taking. All medications that are taken regularly should be taken the day of surgery as you always do. Nevertheless, we need to be informed of what medications you are taking prior to surgery to whether they will affect the procedure or cause any complications.   6. Please inform us of any medication allergies. Also inform us of whether you have allergies to Latex or rubber products or whether you have had any adverse reaction to Lidocaine or Epinephrine.  7. Please inform us of any prosthetic or artificial body parts such as artificial heart valve, joint replacements, etc., or similar condition that might require preoperative antibiotics.   8. We recommend avoidance of alcohol at least two weeks prior to surgery and continued avoidence for at least two weeks after surgery.   9. We recommend discontinuation of tobacco smoking at least two weeks  prior to surgery and continued abstinence for at least two weeks after surgery.  10. Do not plan strenuous exercise, strenuous work or strenuous lifting for approximately four weeks after your surgery.   11. We request if you are unable to make your scheduled surgical appointment, please call us at least  a week in advance or as soon as you are aware of a problem sot aht we can cancel or reschedule you.   12. You MAKE TAKE TYLENOL (acetaminophen) for pain as it is not a blood thinner.   13. PLEASE PLAN TO BE IN TOWN FOR TWO WEEKS FOLLOWING SURGERY, THIS IS IMPORTANT SO YOU CAN BE CHECKED FOR DRESSING CHANGES, SUTURE REMOVAL AND TO MONITOR FOR POSSIBLE COMPLICATIONS.  14.

## 2019-06-20 NOTE — Progress Notes (Signed)
   New Patient Visit  Subjective  Vanessa Barnes is a 34 y.o. female who presents for the following: Skin Tag (R lower buttocks crease, >58yrs, gets irritated) and check spots (R wrist some >89yrs others new, L groin, just noticed recently, Father with hx of skin ca, not sure what kind).   The following portions of the chart were reviewed this encounter and updated as appropriate:  Allergies  Meds  Problems  Med Hx  Surg Hx  Fam Hx      Review of Systems:  No other skin or systemic complaints except as noted in HPI or Assessment and Plan.  Objective  Well appearing patient in no apparent distress; mood and affect are within normal limits.  A focused examination was performed including R wrist, L groin, R buttocks. Relevant physical exam findings are noted in the Assessment and Plan.  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  Will schedule for surgery.  Neoplasm of skin L pubic  Skin / nail biopsy Type of biopsy: tangential   Informed consent: discussed and consent obtained   Timeout: patient name, date of birth, surgical site, and procedure verified   Procedure prep:  Patient was prepped and draped in usual sterile fashion Prep type:  Isopropyl alcohol Anesthesia: the lesion was anesthetized in a standard fashion   Anesthetic:  1% lidocaine w/ epinephrine 1-100,000 buffered w/ 8.4% NaHCO3 Instrument used: flexible razor blade   Outcome: patient tolerated procedure well   Post-procedure details: sterile dressing applied and wound care instructions given   Dressing type: bandage and petrolatum    Specimen 1 - Surgical pathology Differential Diagnosis: D48.5 SK r/o Condyloma/HPV Check Margins: No Verrucous paps 0.4 each (1  piece)  SK r/o Condyloma/HPV  Discussed pt should get a pap smear Discussed Gardasil  Other viral warts (6) R wrist/forearm x 6  Discussed viral etiology and risk of spread.  Discussed treatment options.  Discussed multiple treatments may be required to clear warts.  Discussed possible post-treatment dyspigmentation and risk of recurrence.   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 Nehemiah Massed.  I, Othelia Pulling, RMA, am acting as scribe for Sarina Ser, MD .  Documentation: I have reviewed the above documentation for accuracy and completeness, and I agree with the above.  Sarina Ser, MD

## 2019-06-28 ENCOUNTER — Encounter: Payer: Self-pay | Admitting: Dermatology

## 2019-07-02 ENCOUNTER — Telehealth: Payer: Self-pay

## 2019-07-02 NOTE — Telephone Encounter (Signed)
Left message on voicemail to return my call.  

## 2019-07-02 NOTE — Telephone Encounter (Signed)
-----   Message from Ralene Bathe, MD sent at 06/28/2019  2:08 PM EDT ----- Skin , left pubic SEBORRHEIC KERATOSIS, EARLY  Benign keratosis May want to treat multiple lesions with Ln2 "freezing" at next visit

## 2019-07-02 NOTE — Telephone Encounter (Signed)
Biopsy results discussed with pt  

## 2019-07-25 ENCOUNTER — Other Ambulatory Visit: Payer: Self-pay

## 2019-07-25 DIAGNOSIS — Z1231 Encounter for screening mammogram for malignant neoplasm of breast: Secondary | ICD-10-CM

## 2019-08-05 ENCOUNTER — Ambulatory Visit: Payer: BC Managed Care – PPO

## 2019-08-06 ENCOUNTER — Ambulatory Visit: Payer: BC Managed Care – PPO | Admitting: Dermatology

## 2019-08-06 ENCOUNTER — Other Ambulatory Visit: Payer: Self-pay

## 2019-08-06 DIAGNOSIS — D485 Neoplasm of uncertain behavior of skin: Secondary | ICD-10-CM

## 2019-08-06 MED ORDER — MUPIROCIN 2 % EX OINT: 1.0000 "application " | TOPICAL_OINTMENT | Freq: Every day | CUTANEOUS | 0 refills | Status: DC

## 2019-08-06 NOTE — Patient Instructions (Signed)

## 2019-08-06 NOTE — Progress Notes (Signed)
   Follow-Up Visit   Subjective  Vanessa Barnes is a 34 y.o. female who presents for the following: Procedure (Fibrolipoma vs other of right inf buttock at perinium - [facility] today).  The following portions of the chart were reviewed this encounter and updated as appropriate:  Allergies  Meds  Problems  Med Hx  Surg Hx  Fam Hx     Review of Systems:  No other skin or systemic complaints except as noted in HPI or Assessment and Plan.  Objective  Well appearing patient in no apparent distress; mood and affect are within normal limits.  A focused examination was performed including right buttock. Relevant physical exam findings are noted in the Assessment and Plan.  Objective  Right inf buttock at perineum: 4.0 cm fleshy papule   Assessment & Plan    Neoplasm of uncertain behavior of skin Right inf buttock at perineum  mupirocin ointment (BACTROBAN) 2 %  Skin excision  Lesion length (cm):  4 Lesion width (cm):  1 Margin per side (cm):  0.2 Total excision diameter (cm):  4.4 Informed consent: discussed and consent obtained   Timeout: patient name, date of birth, surgical site, and procedure verified   Procedure prep:  Patient was prepped and draped in usual sterile fashion Prep type:  Isopropyl alcohol and povidone-iodine Anesthesia: the lesion was anesthetized in a standard fashion   Anesthetic:  1% lidocaine w/ epinephrine 1-100,000 buffered w/ 8.4% NaHCO3 Instrument used: #15 blade   Hemostasis achieved with: pressure   Outcome: patient tolerated procedure well with no complications   Post-procedure details: sterile dressing applied and wound care instructions given   Dressing type: bandage and pressure dressing (mupirocin)    Skin repair Complexity:  Complex closure 5 cm Reason for type of repair: reduce tension to allow closure, reduce the risk of dehiscence, infection, and necrosis, reduce subcutaneous dead space and avoid a hematoma, allow closure of the  large defect, preserve normal anatomy, preserve normal anatomical and functional relationships and enhance both functionality and cosmetic results   Undermining: area extensively undermined   Undermining comment:  Undermining defect 1.4 cm Subcutaneous layers (deep stitches):  Suture size:  3-0 Suture type: Vicryl (polyglactin 910)   Subcutaneous suture technique: inverted dermal. Fine/surface layer approximation (top stitches):  Suture size:  3-0 Suture type: nylon   Stitches: simple running   Suture removal (days):  7 Hemostasis achieved with: suture and pressure Outcome: patient tolerated procedure well with no complications   Post-procedure details: sterile dressing applied and wound care instructions given   Dressing type: bandage and pressure dressing (mupirocin)    Specimen 1 - Surgical pathology Differential Diagnosis: Fibrolipoma vs other  Check Margins: No 4.0 cm fleshy papule  Return in about 1 week (around 08/13/2019) for suture removal.   I, Ashok Cordia, CMA, am acting as scribe for Sarina Ser, MD .  Documentation: I have reviewed the above documentation for accuracy and completeness, and I agree with the above.  Sarina Ser, MD

## 2019-08-08 ENCOUNTER — Encounter: Payer: Self-pay | Admitting: Dermatology

## 2019-08-13 ENCOUNTER — Other Ambulatory Visit: Payer: Self-pay

## 2019-08-13 ENCOUNTER — Ambulatory Visit (INDEPENDENT_AMBULATORY_CARE_PROVIDER_SITE_OTHER): Payer: BC Managed Care – PPO | Admitting: Dermatology

## 2019-08-13 DIAGNOSIS — D171 Benign lipomatous neoplasm of skin and subcutaneous tissue of trunk: Secondary | ICD-10-CM

## 2019-08-13 DIAGNOSIS — D179 Benign lipomatous neoplasm, unspecified: Secondary | ICD-10-CM

## 2019-08-13 DIAGNOSIS — Z4802 Encounter for removal of sutures: Secondary | ICD-10-CM

## 2019-08-13 NOTE — Progress Notes (Signed)
   Follow-Up Visit   Subjective  Vanessa Barnes is a 33 y.o. female who presents for the following: suture removal (R inf buttock at perineum - fibrolipoma, patient is here today for suture removal).  The following portions of the chart were reviewed this encounter and updated as appropriate:  Allergies  Meds  Problems  Med Hx  Surg Hx  Fam Hx     Review of Systems:  No other skin or systemic complaints except as noted in HPI or Assessment and Plan.  Objective  Well appearing patient in no apparent distress; mood and affect are within normal limits.  A focused examination was performed including the buttocks and legs. Relevant physical exam findings are noted in the Assessment and Plan.  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  - Discussed pathology results showing benign fibrolipoma - Patient advised to keep steri-strips dry until they fall off. - Scars remodel for a full year. - Once steri-strips fall off, patient can apply over-the-counter silicone scar cream each night to help with scar remodeling if desired. - Patient advised to call with any concerns or if they notice any new or changing lesions.   Return if symptoms worsen or fail to improve.  Luther Redo, CMA, am acting as scribe for Sarina Ser, MD .  Documentation: I have reviewed the above documentation for accuracy and completeness, and I agree with the above.  Sarina Ser, MD

## 2019-08-17 ENCOUNTER — Encounter: Payer: Self-pay | Admitting: Dermatology

## 2019-08-30 ENCOUNTER — Other Ambulatory Visit: Payer: Self-pay

## 2019-08-30 ENCOUNTER — Ambulatory Visit
Admission: RE | Admit: 2019-08-30 | Discharge: 2019-08-30 | Disposition: A | Discharge status: Home / Self Care | Payer: BC Managed Care – PPO | Source: Ambulatory Visit

## 2019-08-30 DIAGNOSIS — Z1231 Encounter for screening mammogram for malignant neoplasm of breast: Secondary | ICD-10-CM | POA: Diagnosis not present

## 2021-05-20 IMAGING — MG DIGITAL SCREENING BILAT W/ TOMO W/ CAD
8 series · 8 of 24 positions shown · non-contrast
Comparison: None.

CLINICAL DATA: Screening.

EXAM:
DIGITAL SCREENING BILATERAL MAMMOGRAM WITH TOMO AND CAD

[R MLO synth-2D]
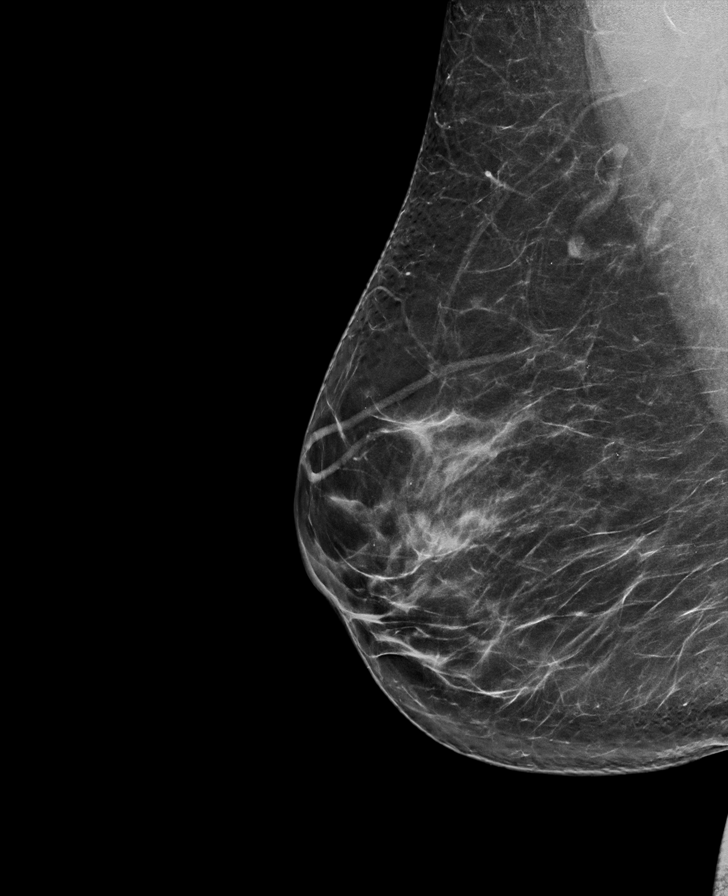

[R CC synth-2D]
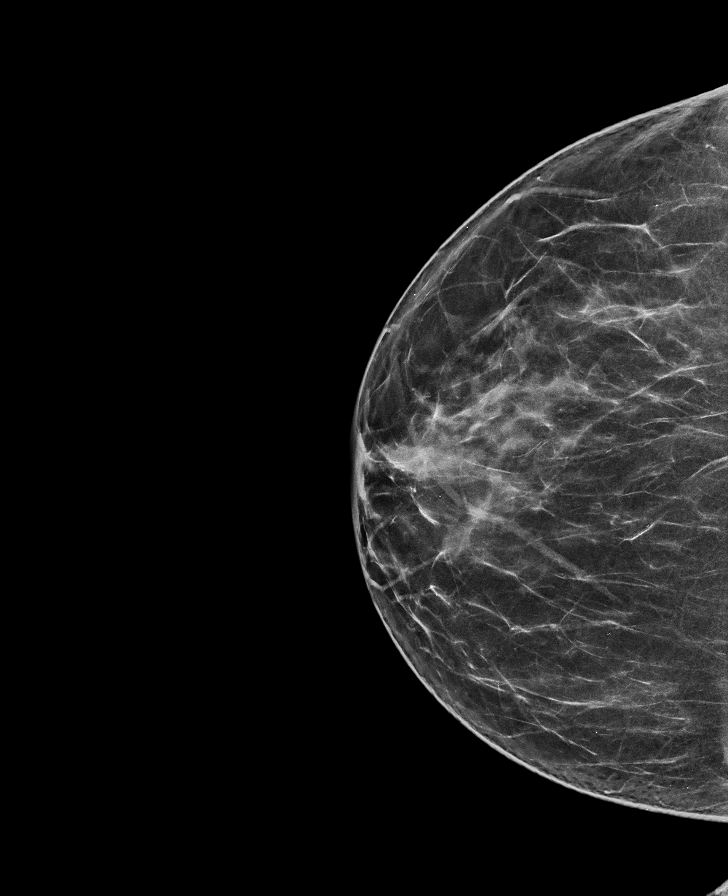

[L MLO synth-2D]
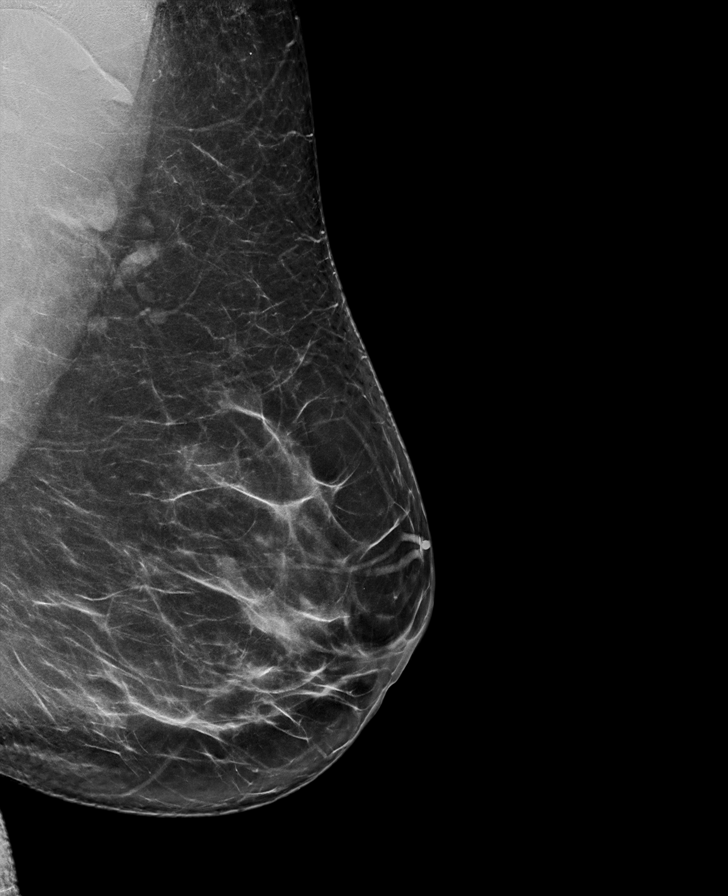

[L CC synth-2D]
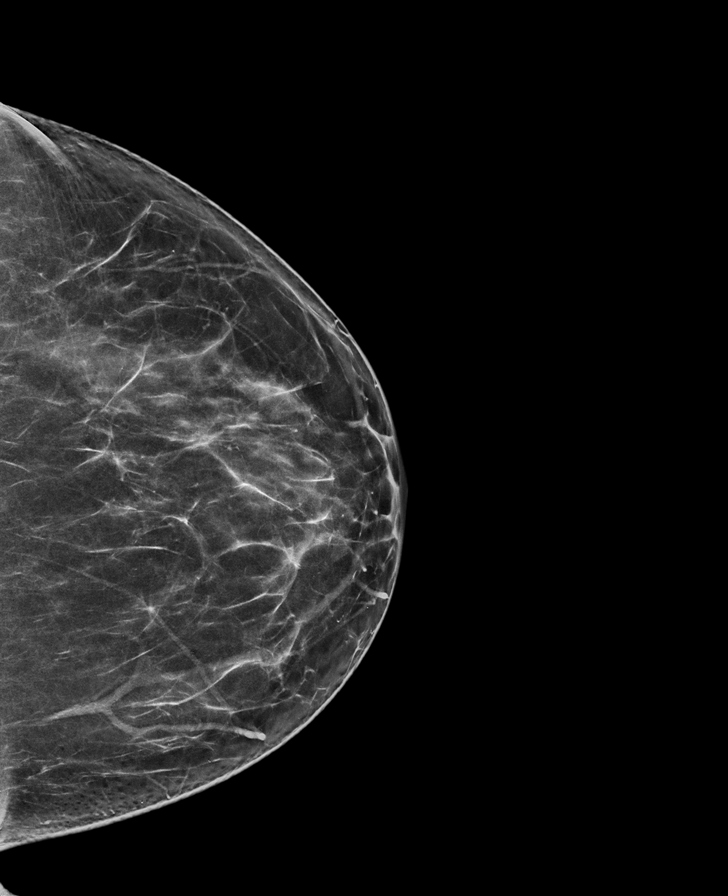

[R CC tomo · tomo slice 36/71.0]
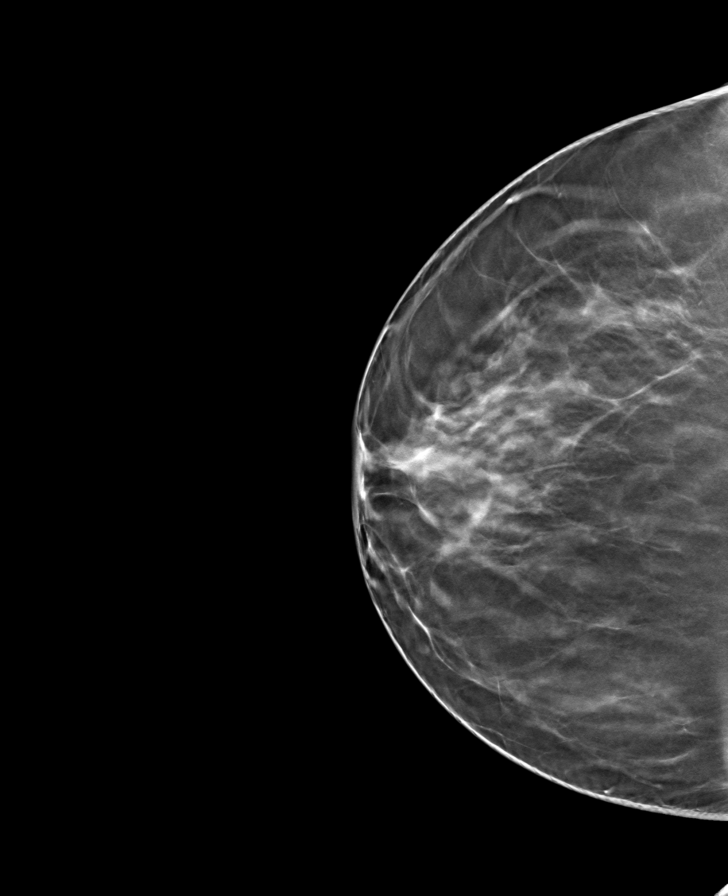

[L CC tomo · tomo slice 36/71.0]
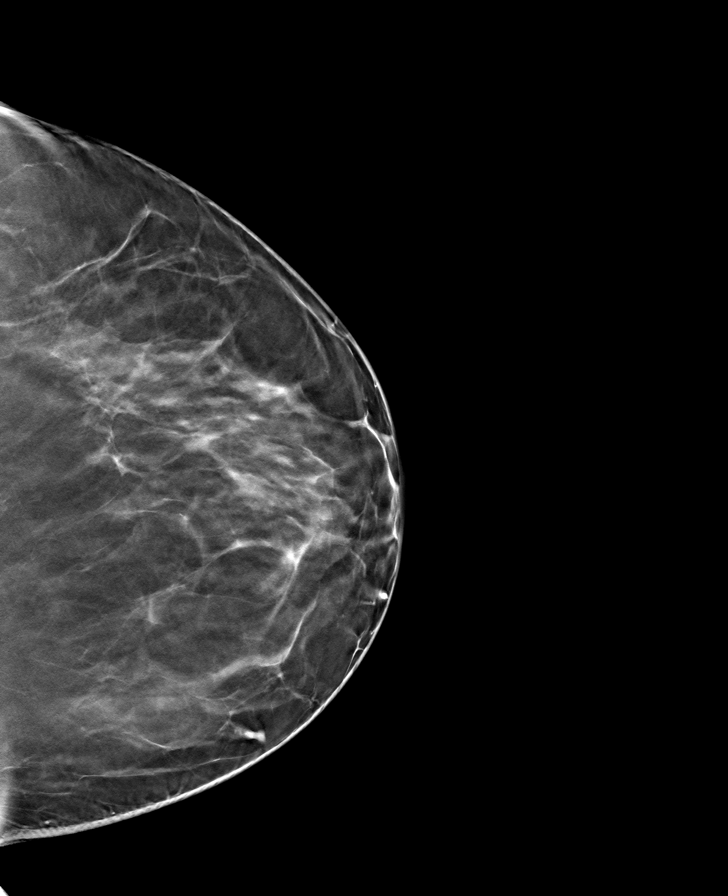

[R MLO tomo · tomo slice 41/80.0]
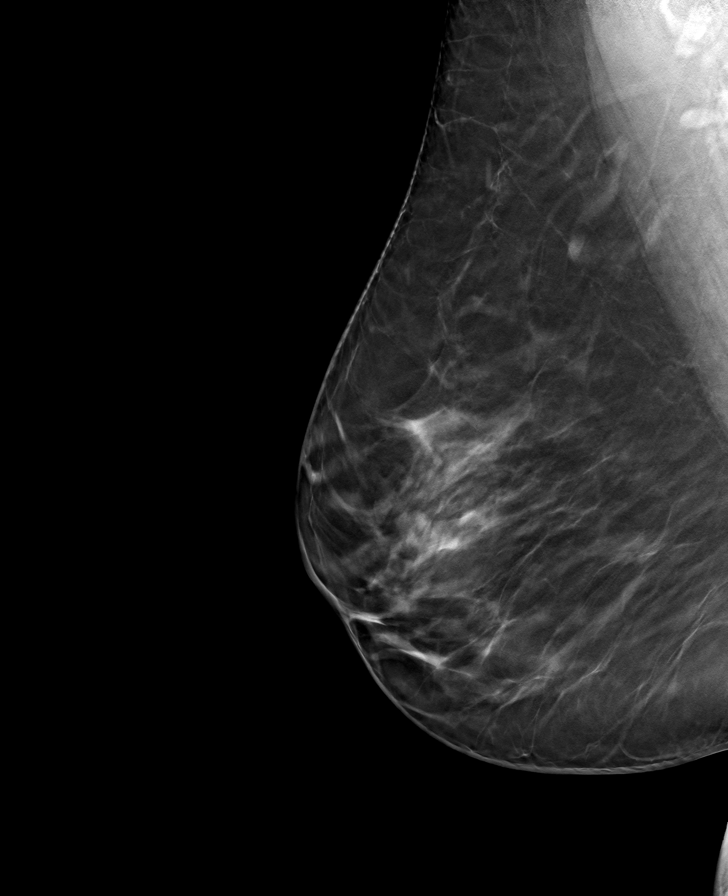

[L MLO tomo · tomo slice 43/84.0]
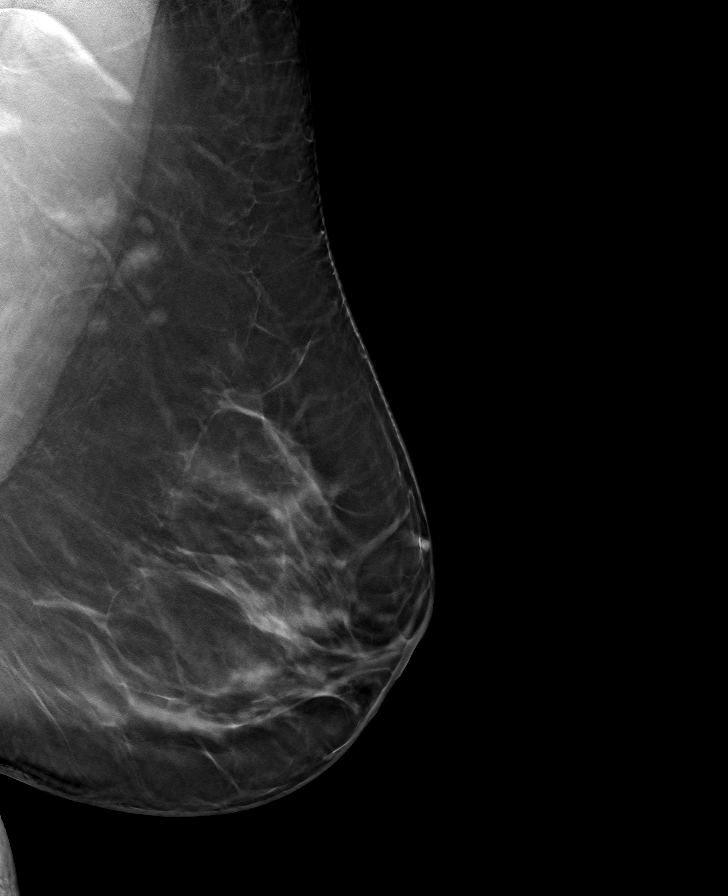

[8 of 24 positions shown; findings below may reference images not displayed]

ACR Breast Density Category c: The breast tissue is heterogeneously
dense, which may obscure small masses
FINDINGS: There are no findings suspicious for malignancy. Images were
processed with CAD.
IMPRESSION: No mammographic evidence of malignancy. A result letter of this
screening mammogram will be mailed directly to the patient.

RECOMMENDATION:
Screening mammogram at age 38. (Code:[id number])

BI-RADS CATEGORY  1: Negative.

## 2021-10-01 ENCOUNTER — Other Ambulatory Visit: Payer: Self-pay | Admitting: Obstetrics and Gynecology
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
## 2021-11-29 ENCOUNTER — Inpatient Hospital Stay: Admission: RE | Admit: 2021-11-29 | Payer: BC Managed Care – PPO | Source: Ambulatory Visit

## 2021-11-30 ENCOUNTER — Other Ambulatory Visit: Payer: Self-pay

## 2021-11-30 ENCOUNTER — Encounter
Admission: RE | Admit: 2021-11-30 | Discharge: 2021-11-30 | Disposition: A | Discharge status: Home / Self Care | Payer: BC Managed Care – PPO | Source: Ambulatory Visit | Attending: Obstetrics and Gynecology | Admitting: Obstetrics and Gynecology

## 2021-11-30 VITALS — Ht 70.0 in | Wt 257.0 lb

## 2021-11-30 DIAGNOSIS — Z01818 Encounter for other preprocedural examination: Secondary | ICD-10-CM

## 2021-11-30 HISTORY — DX: Anxiety disorder, unspecified: F41.9

## 2021-11-30 HISTORY — DX: Prediabetes: R73.03

## 2021-11-30 NOTE — Patient Instructions (Addendum)
Your procedure is scheduled on: 12/08/21 Report to [place]. To find out your arrival time please call [phone] between 1PM - 3PM on 12/07/21 .  Remember: Instructions that are not followed completely may result in serious medical risk, up to and including death, or upon the discretion of your surgeon and anesthesiologist your surgery may need to be rescheduled.     _X__ 1. Do not eat food after midnight the night before your procedure.                 No gum chewing or hard candies. You may drink clear liquids up to 2 hours                 before you are scheduled to arrive for your surgery- DO not drink clear                 liquids within 2 hours of the start of your surgery.                 Clear Liquids include:  water, apple juice without pulp, clear carbohydrate                 drink such as Clearfast or Gatorade, Black Coffee or Tea (Do not add                 anything to coffee or tea). Diabetics water only  __X__2.  On the morning of surgery brush your teeth with toothpaste and water, you                 may rinse your mouth with mouthwash if you wish.  Do not swallow any              toothpaste of mouthwash.     _X__ 3.  No Alcohol for 24 hours before or after surgery.   _X__ 4.  Do Not Smoke or use e-cigarettes For 24 Hours Prior to Your Surgery.                 Do not use any chewable tobacco products for at least 6 hours prior to                 surgery.  ____  5.  Bring all medications with you on the day of surgery if instructed.   __X__  6.  Notify your doctor if there is any change in your medical condition      (cold, fever, infections).     Do not wear jewelry, make-up, hairpins, clips or nail polish. Do not wear lotions, powders, or perfumes. You may wear deodorant. Do not shave 48 hours prior to surgery. Men may shave face and neck. Do not bring valuables to the hospital.    [facility] is not  responsible for any belongings or valuables.  Contacts, dentures/partials or body piercings may not be worn into surgery. Bring a case for your contacts, glasses or hearing aids, a denture cup will be supplied. Leave your suitcase in the car. After surgery it may be brought to your room. For patients admitted to the hospital, discharge time is determined by your treatment team.   Patients discharged the day of surgery will not be allowed to drive home.    __X__ Take these medicines the morning of surgery with A SIP OF WATER:    1. cetirizine (ZYRTEC) 10 MG tablet   2. lamoTRIgine (  LAMICTAL) 200 MG tablet   3. Gabapentin if needed  4.  5.  6.  ____ Fleet Enema (as directed)   ____ Use CHG Soap/SAGE wipes as directed  ____ Use inhalers on the day of surgery  ____ Stop metformin/Janumet/Farxiga 2 days prior to surgery    ____ Take 1/2 of usual insulin dose the night before surgery. No insulin the morning          of surgery.   ____ Stop Blood Thinners Coumadin/Plavix/Xarelto/Pleta/Pradaxa/Eliquis/Effient/Aspirin  on   Or contact your Surgeon, Cardiologist or Medical Doctor regarding  ability to stop your blood thinners  __X__ Stop Anti-inflammatories 7 days before surgery such as Advil, Ibuprofen, Motrin,  BC or Goodies Powder, Naprosyn, Naproxen, Aleve, Aspirin    __X__ Stop all herbal supplements, fish oil or vitamins  until after surgery.    ____ Bring C-Pap to the hospital.
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
## 2021-12-08 ENCOUNTER — Ambulatory Visit
Admission: RE | Admit: 2021-12-08 | Discharge: 2021-12-08 | Disposition: A | Discharge status: Home / Self Care | Payer: BC Managed Care – PPO | Source: Ambulatory Visit | Attending: Obstetrics and Gynecology | Admitting: Obstetrics and Gynecology

## 2021-12-08 ENCOUNTER — Other Ambulatory Visit: Payer: Self-pay

## 2021-12-08 ENCOUNTER — Encounter
Admission: RE | Disposition: A | Discharge status: Home / Self Care | Payer: Self-pay | Source: Ambulatory Visit | Attending: Obstetrics and Gynecology

## 2021-12-08 ENCOUNTER — Ambulatory Visit: Payer: BC Managed Care – PPO | Admitting: Anesthesiology

## 2021-12-08 ENCOUNTER — Encounter: Payer: Self-pay | Admitting: Obstetrics and Gynecology

## 2021-12-08 DIAGNOSIS — N8302 Follicular cyst of left ovary: Secondary | ICD-10-CM | POA: Diagnosis not present

## 2021-12-08 DIAGNOSIS — N72 Inflammatory disease of cervix uteri: Secondary | ICD-10-CM | POA: Diagnosis not present

## 2021-12-08 DIAGNOSIS — E282 Polycystic ovarian syndrome: Secondary | ICD-10-CM | POA: Diagnosis not present

## 2021-12-08 DIAGNOSIS — N8301 Follicular cyst of right ovary: Secondary | ICD-10-CM | POA: Insufficient documentation

## 2021-12-08 DIAGNOSIS — G8929 Other chronic pain: Secondary | ICD-10-CM | POA: Diagnosis present

## 2021-12-08 DIAGNOSIS — Z87891 Personal history of nicotine dependence: Secondary | ICD-10-CM | POA: Diagnosis not present

## 2021-12-08 DIAGNOSIS — R102 Pelvic and perineal pain: Secondary | ICD-10-CM | POA: Insufficient documentation

## 2021-12-08 DIAGNOSIS — N946 Dysmenorrhea, unspecified: Secondary | ICD-10-CM | POA: Diagnosis present

## 2021-12-08 DIAGNOSIS — N921 Excessive and frequent menstruation with irregular cycle: Secondary | ICD-10-CM | POA: Insufficient documentation

## 2021-12-08 DIAGNOSIS — R519 Headache, unspecified: Secondary | ICD-10-CM | POA: Diagnosis not present

## 2021-12-08 DIAGNOSIS — Z01818 Encounter for other preprocedural examination: Secondary | ICD-10-CM

## 2021-12-08 HISTORY — PX: ROBOTIC ASSISTED TOTAL HYSTERECTOMY WITH BILATERAL SALPINGO OOPHERECTOMY: SHX6086

## 2021-12-08 HISTORY — PX: CYSTOSCOPY: SHX5120

## 2021-12-08 LAB — TYPE AND SCREEN
ABO/RH(D): A POS
Antibody Screen: NEGATIVE

## 2021-12-08 LAB — ABO/RH: ABO/RH(D): A POS

## 2021-12-08 LAB — POCT PREGNANCY, URINE: Preg Test, Ur: NEGATIVE

## 2021-12-08 SURGERY — HYSTERECTOMY, TOTAL, ROBOT-ASSISTED, LAPAROSCOPIC, WITH BILATERAL SALPINGO-OOPHORECTOMY
Anesthesia: General

## 2021-12-08 MED ORDER — DEXMEDETOMIDINE HCL IN NACL 80 MCG/20ML IV SOLN
INTRAVENOUS | Status: AC
  Filled 2021-12-08: qty 20

## 2021-12-08 MED ORDER — CEFAZOLIN SODIUM-DEXTROSE 2-4 GM/100ML-% IV SOLN
2.0000 g | INTRAVENOUS | Status: AC
  Administered 2021-12-08: 2 g via INTRAVENOUS

## 2021-12-08 MED ORDER — PROPOFOL 10 MG/ML IV BOLUS
INTRAVENOUS | Status: AC
  Filled 2021-12-08: qty 20

## 2021-12-08 MED ORDER — SUGAMMADEX SODIUM 200 MG/2ML IV SOLN
INTRAVENOUS | Status: DC | PRN
  Administered 2021-12-08: 250 mg via INTRAVENOUS

## 2021-12-08 MED ORDER — OXYCODONE HCL 5 MG PO TABS: 5.0000 mg | ORAL_TABLET | Freq: Once | ORAL | Status: AC | PRN

## 2021-12-08 MED ORDER — ONDANSETRON HCL 4 MG/2ML IJ SOLN
INTRAMUSCULAR | Status: DC | PRN
  Administered 2021-12-08: 4 mg via INTRAVENOUS

## 2021-12-08 MED ORDER — ACETAMINOPHEN 10 MG/ML IV SOLN
INTRAVENOUS | Status: AC
  Filled 2021-12-08: qty 100

## 2021-12-08 MED ORDER — FENTANYL CITRATE (PF) 100 MCG/2ML IJ SOLN
INTRAMUSCULAR | Status: AC
  Administered 2021-12-08: 25 ug via INTRAVENOUS
  Filled 2021-12-08: qty 2

## 2021-12-08 MED ORDER — FAMOTIDINE 20 MG PO TABS
ORAL_TABLET | ORAL | Status: AC
  Administered 2021-12-08: 20 mg via ORAL
  Filled 2021-12-08: qty 1

## 2021-12-08 MED ORDER — HYDROCODONE-ACETAMINOPHEN 5-325 MG PO TABS: 1.0000 | ORAL_TABLET | Freq: Four times a day (QID) | ORAL | 0 refills | Status: AC | PRN

## 2021-12-08 MED ORDER — FAMOTIDINE 20 MG PO TABS: 20.0000 mg | ORAL_TABLET | Freq: Once | ORAL | Status: AC

## 2021-12-08 MED ORDER — 0.9 % SODIUM CHLORIDE (POUR BTL) OPTIME
TOPICAL | Status: DC | PRN
  Administered 2021-12-08: 500 mL

## 2021-12-08 MED ORDER — FENTANYL CITRATE (PF) 100 MCG/2ML IJ SOLN
INTRAMUSCULAR | Status: AC
  Filled 2021-12-08: qty 2

## 2021-12-08 MED ORDER — DEXMEDETOMIDINE HCL IN NACL 200 MCG/50ML IV SOLN
INTRAVENOUS | Status: DC | PRN
  Administered 2021-12-08: 4 ug via INTRAVENOUS

## 2021-12-08 MED ORDER — ORAL CARE MOUTH RINSE: 15.0000 mL | Freq: Once | OROMUCOSAL | Status: AC

## 2021-12-08 MED ORDER — CHLORHEXIDINE GLUCONATE 0.12 % MT SOLN
OROMUCOSAL | Status: AC
  Administered 2021-12-08: 15 mL via OROMUCOSAL
  Filled 2021-12-08: qty 15

## 2021-12-08 MED ORDER — BUPIVACAINE HCL (PF) 0.5 % IJ SOLN
INTRAMUSCULAR | Status: AC
  Filled 2021-12-08: qty 30

## 2021-12-08 MED ORDER — LIDOCAINE HCL (CARDIAC) PF 100 MG/5ML IV SOSY
PREFILLED_SYRINGE | INTRAVENOUS | Status: DC | PRN
  Administered 2021-12-08: 100 mg via INTRAVENOUS

## 2021-12-08 MED ORDER — ONDANSETRON 4 MG PO TBDP: 4.0000 mg | ORAL_TABLET | Freq: Four times a day (QID) | ORAL | 0 refills | Status: AC | PRN

## 2021-12-08 MED ORDER — ACETAMINOPHEN 10 MG/ML IV SOLN
INTRAVENOUS | Status: DC | PRN
  Administered 2021-12-08: 1000 mg via INTRAVENOUS

## 2021-12-08 MED ORDER — MIDAZOLAM HCL 2 MG/2ML IJ SOLN
INTRAMUSCULAR | Status: DC | PRN
  Administered 2021-12-08: 2 mg via INTRAVENOUS

## 2021-12-08 MED ORDER — MIDAZOLAM HCL 2 MG/2ML IJ SOLN
INTRAMUSCULAR | Status: AC
  Filled 2021-12-08: qty 2

## 2021-12-08 MED ORDER — FENTANYL CITRATE (PF) 100 MCG/2ML IJ SOLN
INTRAMUSCULAR | Status: DC | PRN
  Administered 2021-12-08 (×2): 50 ug via INTRAVENOUS

## 2021-12-08 MED ORDER — CHLORHEXIDINE GLUCONATE 0.12 % MT SOLN: 15.0000 mL | Freq: Once | OROMUCOSAL | Status: AC

## 2021-12-08 MED ORDER — IBUPROFEN 600 MG PO TABS: 600.0000 mg | ORAL_TABLET | Freq: Four times a day (QID) | ORAL | 0 refills | Status: AC

## 2021-12-08 MED ORDER — BUPIVACAINE HCL 0.5 % IJ SOLN
INTRAMUSCULAR | Status: DC | PRN
  Administered 2021-12-08: 17 mL

## 2021-12-08 MED ORDER — KETOROLAC TROMETHAMINE 30 MG/ML IJ SOLN
INTRAMUSCULAR | Status: DC | PRN
  Administered 2021-12-08: 30 mg via INTRAVENOUS

## 2021-12-08 MED ORDER — CEFAZOLIN SODIUM-DEXTROSE 2-4 GM/100ML-% IV SOLN
INTRAVENOUS | Status: AC
  Filled 2021-12-08: qty 100

## 2021-12-08 MED ORDER — LACTATED RINGERS IV SOLN: INTRAVENOUS | Status: DC

## 2021-12-08 MED ORDER — FENTANYL CITRATE (PF) 100 MCG/2ML IJ SOLN
25.0000 ug | INTRAMUSCULAR | Status: DC | PRN
  Administered 2021-12-08 (×3): 25 ug via INTRAVENOUS

## 2021-12-08 MED ORDER — PROPOFOL 10 MG/ML IV BOLUS
INTRAVENOUS | Status: DC | PRN
  Administered 2021-12-08: 200 mg via INTRAVENOUS

## 2021-12-08 MED ORDER — POVIDONE-IODINE 10 % EX SWAB
2.0000 | Freq: Once | CUTANEOUS | Status: AC
  Administered 2021-12-08: 2 via TOPICAL

## 2021-12-08 MED ORDER — OXYCODONE HCL 5 MG/5ML PO SOLN: 5.0000 mg | Freq: Once | ORAL | Status: AC | PRN

## 2021-12-08 MED ORDER — OXYCODONE HCL 5 MG PO TABS
ORAL_TABLET | ORAL | Status: AC
  Administered 2021-12-08: 5 mg via ORAL
  Filled 2021-12-08: qty 1

## 2021-12-08 MED ORDER — DEXAMETHASONE SODIUM PHOSPHATE 10 MG/ML IJ SOLN
INTRAMUSCULAR | Status: DC | PRN
  Administered 2021-12-08: 10 mg via INTRAVENOUS

## 2021-12-08 MED ORDER — ROCURONIUM BROMIDE 100 MG/10ML IV SOLN
INTRAVENOUS | Status: DC | PRN
  Administered 2021-12-08: 10 mg via INTRAVENOUS
  Administered 2021-12-08 (×2): 20 mg via INTRAVENOUS
  Administered 2021-12-08: 50 mg via INTRAVENOUS

## 2021-12-08 SURGICAL SUPPLY — 68 items
BAG URINE DRAIN 2000ML AR STRL (UROLOGICAL SUPPLIES) ×2 IMPLANT
BASIN KIT SINGLE STR (MISCELLANEOUS) ×2 IMPLANT
BLADE SURG SZ11 CARB STEEL (BLADE) ×2 IMPLANT
CANNULA CAP OBTURATR AIRSEAL 8 (CAP) ×2 IMPLANT
CATH FOLEY 2WAY  5CC 16FR (CATHETERS) ×2
CATH URTH 16FR FL 2W BLN LF (CATHETERS) ×2 IMPLANT
COVER TIP SHEARS 8 DVNC (MISCELLANEOUS) ×2 IMPLANT
COVER TIP SHEARS 8MM DA VINCI (MISCELLANEOUS) ×2
DERMABOND ADVANCED .7 DNX12 (GAUZE/BANDAGES/DRESSINGS) ×2 IMPLANT
DRAPE 3/4 80X56 (DRAPES) IMPLANT
DRAPE ARM DVNC X/XI (DISPOSABLE) ×8 IMPLANT
DRAPE COLUMN DVNC XI (DISPOSABLE) IMPLANT
DRAPE DA VINCI XI ARM (DISPOSABLE) ×8
DRAPE DA VINCI XI COLUMN (DISPOSABLE) ×2
DRAPE ROBOT W/ LEGGING 30X125 (DRAPES) ×2 IMPLANT
DRAPE UNDER BUTTOCK W/FLU (DRAPES) ×2 IMPLANT
ELECT REM PT RETURN 9FT ADLT (ELECTROSURGICAL) ×2
ELECTRODE REM PT RTRN 9FT ADLT (ELECTROSURGICAL) ×2 IMPLANT
GAUZE 4X4 16PLY ~~LOC~~+RFID DBL (SPONGE) ×2 IMPLANT
GLOVE BIO SURGEON STRL SZ7 (GLOVE) ×6 IMPLANT
GLOVE SURG UNDER POLY LF SZ7.5 (GLOVE) ×6 IMPLANT
GOWN STRL REUS W/ TWL LRG LVL3 (GOWN DISPOSABLE) ×6 IMPLANT
GOWN STRL REUS W/ TWL XL LVL3 (GOWN DISPOSABLE) ×2 IMPLANT
GOWN STRL REUS W/TWL LRG LVL3 (GOWN DISPOSABLE) ×6
GOWN STRL REUS W/TWL XL LVL3 (GOWN DISPOSABLE) ×4
IRRIGATION STRYKERFLOW (MISCELLANEOUS) IMPLANT
IRRIGATOR STRYKERFLOW (MISCELLANEOUS)
IRRIGATOR SUCT 8 DISP DVNC XI (IRRIGATION / IRRIGATOR) IMPLANT
IRRIGATOR SUCTION 8MM XI DISP (IRRIGATION / IRRIGATOR)
IV LACTATED RINGERS 1000ML (IV SOLUTION) ×2 IMPLANT
IV NS 1000ML (IV SOLUTION) ×2
IV NS 1000ML BAXH (IV SOLUTION) ×2 IMPLANT
KIT PINK PAD W/HEAD ARE REST (MISCELLANEOUS) ×2
KIT PINK PAD W/HEAD ARM REST (MISCELLANEOUS) ×2 IMPLANT
KIT TURNOVER CYSTO (KITS) ×2 IMPLANT
LABEL OR SOLS (LABEL) ×2 IMPLANT
MANIFOLD NEPTUNE II (INSTRUMENTS) ×2 IMPLANT
MANIPULATOR UTERINE 4.5 ZUMI (MISCELLANEOUS) ×2 IMPLANT
MANIPULATOR VCARE LG CRV RETR (MISCELLANEOUS) IMPLANT
MANIPULATOR VCARE SML CRV RETR (MISCELLANEOUS) IMPLANT
MANIPULATOR VCARE STD CRV RETR (MISCELLANEOUS) IMPLANT
NEEDLE HYPO 22GX1.5 SAFETY (NEEDLE) ×2 IMPLANT
NS IRRIG 500ML POUR BTL (IV SOLUTION) ×2 IMPLANT
OBTURATOR OPTICAL STANDARD 8MM (TROCAR) ×2
OBTURATOR OPTICAL STND 8 DVNC (TROCAR) ×2
OBTURATOR OPTICALSTD 8 DVNC (TROCAR) ×2 IMPLANT
OCCLUDER COLPOPNEUMO (BALLOONS) IMPLANT
PACK LAP CHOLECYSTECTOMY (MISCELLANEOUS) ×2 IMPLANT
PAD PREP 24X41 OB/GYN DISP (PERSONAL CARE ITEMS) ×2 IMPLANT
SCRUB CHG 4% DYNA-HEX 4OZ (MISCELLANEOUS) ×2 IMPLANT
SEAL CANN UNIV 5-8 DVNC XI (MISCELLANEOUS) ×6 IMPLANT
SEAL XI 5MM-8MM UNIVERSAL (MISCELLANEOUS) ×6
SEALER VESSEL DA VINCI XI (MISCELLANEOUS) ×2
SEALER VESSEL EXT DVNC XI (MISCELLANEOUS) ×2 IMPLANT
SET CYSTO W/LG BORE CLAMP LF (SET/KITS/TRAYS/PACK) ×2 IMPLANT
SET TUBE FILTERED XL AIRSEAL (SET/KITS/TRAYS/PACK) ×2 IMPLANT
SOLUTION ELECTROLUBE (MISCELLANEOUS) ×2 IMPLANT
SPONGE T-LAP 18X18 ~~LOC~~+RFID (SPONGE) IMPLANT
SURGILUBE 2OZ TUBE FLIPTOP (MISCELLANEOUS) ×2 IMPLANT
SUT MNCRL 4-0 (SUTURE) ×4
SUT MNCRL 4-0 27XMFL (SUTURE) ×4
SUT VIC AB 0 CT2 27 (SUTURE) IMPLANT
SUT VLOC 90 S/L VL9 GS22 (SUTURE) ×2 IMPLANT
SUTURE MNCRL 4-0 27XMF (SUTURE) ×2 IMPLANT
SYR 10ML LL (SYRINGE) ×2 IMPLANT
SYR 50ML LL SCALE MARK (SYRINGE) ×2 IMPLANT
TRAP FLUID SMOKE EVACUATOR (MISCELLANEOUS) ×2 IMPLANT
WATER STERILE IRR 500ML POUR (IV SOLUTION) ×2 IMPLANT

## 2021-12-08 NOTE — Anesthesia Preprocedure Evaluation (Signed)
Anesthesia Evaluation  Patient identified by MRN, date of birth, ID band Patient awake    Reviewed: Allergy & Precautions, NPO status , Patient's Chart, lab work & pertinent test results  History of Anesthesia Complications Negative for: history of anesthetic complications  Airway Mallampati: III  TM Distance: <3 FB Neck ROM: full    Dental  (+) Chipped   Pulmonary neg shortness of breath, sleep apnea , former smoker   Pulmonary exam normal        Cardiovascular Exercise Tolerance: Good (-) angina (-) Past MI negative cardio ROS Normal cardiovascular exam     Neuro/Psych  Headaches  Anxiety      negative psych ROS   GI/Hepatic negative GI ROS, Neg liver ROS,neg GERD  ,,  Endo/Other  negative endocrine ROS    Renal/GU      Musculoskeletal   Abdominal   Peds  Hematology negative hematology ROS (+)   Anesthesia Other Findings Past Medical History: No date: Anxiety No date: Migraine No date: Pre-diabetes  Past Surgical History: No date: ABDOMINAL SURGERY     Comment:  abd mass removed No date: WISDOM TOOTH EXTRACTION  BMI    Body Mass Index: 36.88 kg/m      Reproductive/Obstetrics negative OB ROS                             Anesthesia Physical Anesthesia Plan  ASA: 3  Anesthesia Plan: General ETT   Post-op Pain Management:    Induction: Intravenous  PONV Risk Score and Plan: Ondansetron, Dexamethasone, Midazolam and Treatment may vary due to age or medical condition  Airway Management Planned: Oral ETT  Additional Equipment:   Intra-op Plan:   Post-operative Plan: Extubation in OR  Informed Consent: I have reviewed the patients History and Physical, chart, labs and discussed the procedure including the risks, benefits and alternatives for the proposed anesthesia with the patient or authorized representative who has indicated his/her understanding and acceptance.      Dental Advisory Given  Plan Discussed with: Anesthesiologist, CRNA and Surgeon  Anesthesia Plan Comments: (Patient consented for risks of anesthesia including but not limited to:  - adverse reactions to medications - damage to eyes, teeth, lips or other oral mucosa - nerve damage due to positioning  - sore throat or hoarseness - Damage to heart, brain, nerves, lungs, other parts of body or loss of life  Patient voiced understanding.)       Anesthesia Quick Evaluation

## 2021-12-08 NOTE — Discharge Instructions (Signed)

## 2021-12-08 NOTE — Anesthesia Procedure Notes (Signed)
Procedure Name: Intubation Date/Time: 12/08/2021 7:51 AM  Performed by: Babs Sciara, CRNAPre-anesthesia Checklist: Patient identified, Emergency Drugs available, Suction available, Patient being monitored and Timeout performed Patient Re-evaluated:Patient Re-evaluated prior to induction Oxygen Delivery Method: Circle system utilized Preoxygenation: Pre-oxygenation with 100% oxygen Induction Type: IV induction Ventilation: Mask ventilation without difficulty Laryngoscope Size: McGraph and 3 Grade View: Grade I Tube type: Oral Tube size: 7.0 mm Number of attempts: 1 Airway Equipment and Method: Stylet and Bite block Placement Confirmation: ETT inserted through vocal cords under direct vision, positive ETCO2 and breath sounds checked- equal and bilateral Secured at: 21 (lip) cm Tube secured with: Tape Dental Injury: Teeth and Oropharynx as per pre-operative assessment  Comments: Soft bite block

## 2021-12-08 NOTE — Transfer of Care (Signed)
Immediate Anesthesia Transfer of Care Note  Patient: Vanessa Barnes  Procedure(s) Performed: XI ROBOTIC ASSISTED TOTAL HYSTERECTOMY WITH BILATERAL SALPINGO OOPHORECTOMY (Bilateral) CYSTOSCOPY  Patient Location: PACU  Anesthesia Type:General  Level of Consciousness: awake, alert , and oriented  Airway & Oxygen Therapy: Patient Spontanous Breathing and Patient connected to face mask oxygen  Post-op Assessment: Report given to RN and Post -op Vital signs reviewed and stable  Post vital signs: Reviewed and stable  Last Vitals:  Vitals Value Taken Time  BP 135/83 12/08/21 1015  Temp 37.4 C 12/08/21 1012  Pulse 86 12/08/21 1018  Resp 19 12/08/21 1018  SpO2 100 % 12/08/21 1018  Vitals shown include unvalidated device data.  Last Pain:  Vitals:   12/08/21 0640  TempSrc: Temporal  PainSc: 2          Complications: No notable events documented.

## 2021-12-08 NOTE — Op Note (Signed)
Operative Note    Name: Vanessa Barnes  Date of Service: 12/08/2021  DOB: Mar 02, 1985  MRN: [id number]   Pre-Operative Diagnosis:   1. Chronic pelvic pain in female [R10.2, G89.29]  2. Dysmenorrhea [N94.6]    Post-Operative Diagnosis:  1. Chronic pelvic pain in female [R10.2, G89.29]  2. Dysmenorrhea [N94.6]   Procedures:  1. Robot assisted total laparoscopic hysterectomy, bilateral salpingo-oophrectomy 2. Cystoscopy  Primary Surgeon: Prentice Docker, MD   EBL: 25 mL   IVF: 800 mL   Urine output: 250 mL  Specimens: Uterus with bilateral fallopian tubes and ovaries  Drains: none  Complications: None   Disposition: PACU   Condition: Stable   Findings:  Normal-appearing uterus, cervix, and fallopian tubes with only very mild adhesive disease. Enlarged bilateral ovaries with no obvious cysts  Procedure Summary:  The patient was taken to the operating room where general anesthesia was administered and found to be adequate. She was placed in the dorsal supine lithotomy position in [place] stirrups and prepped and draped in the usual sterile fashion. After a timeout was called an indwelling catheter was placed in her bladder.  A sterile speculum was placed in her vagina.  The anterior lip of the cervix was grasped with the single-tooth tenaculum.  The cervix was serially dilated to an 11 Pratt dilator.  The medium Vcare device was placed in accordance to the manufacturer's recommendations.  The speculum was removed.   Attention was turned to the abdomen where after injection of local anesthetic, an 8 mm infraumbilical incision was made with the scalpel. Entry into the abdomen was obtained via Optiview trocar technique (a blunt entry technique with camera visualization through the obturator upon entry). Verification of entry into the abdomen was obtained using opening pressures. The abdomen was insufflated with CO2. The camera was introduced through the trocar with  verification of atraumatic entry.  Right and left abdominal entry sites were created after injection of local anesthetic about 8 cm lateral to the umbilical port in accordance with the Intuitive manufacturer's recommendations.  An additional port was placed 8 cm lateral to the right abdominal port with verification of clearance above the iliac crest by more than 2 cm.  The port sites were 8 mm.  The intuitive trochars were introduced under intra-abdominal camera visualization without difficulty   The XI robot was docked on the patient's left.  Clearance was verified from the patient's legs.  Through the umbilical port the camera was placed.  Through the port attached to arm 3 the monopolar scissors were placed.  Through the port attached to arm 4 the forced bipolar forceps were was placed.  The vessel sealer was attached to port 1.   After inspection of the abdomen and pelvis with the above-noted findings, the bilateral ureters were identified and found to be well away from the operative area of interest.  The right round ligament was grasped, cauterized and transected.  The mesosalpinx was dissected to the level of the infundibulopelvic ligament.  After verifying the ureter was clear, the posterior broad ligament was opened and a window was created to transect the right infundibulopelvic ligament.  Once all structures were clear, including the ureter, the right infundibulopelvic ligament was grasped with the vessel sealer, cauterized, and transected.  The anterior leaf of the broad ligament was opened to the level of the internal cervical os and a bladder flap was created without difficulty.  The posterior leaf of the broad ligament was then opened. The right uterine artery  was skeletonized and identified and after ligation was transected with the Vessel Sealer device. The same procedure was carried out on the left side, again with great care to avoid the left ureter. The colpotomy was performed using monopolar  electrocautery in a circumferential fashion following the KOH ring.  The uterus, fallopian tubes, ovaries, and cervix were removed through the vagina.   Closure of the vaginal cuff was undertaken using the V-lock stitch in a running fashion. Three interrupted stitches of 0 Vicryl were thrown along the cuff closure to provide additional support and hemostasis. All vascular pedicles were inspected and found to be hemostatic after lowering the intra-abdominal pressure to 5 mmHg.  All instruments removed from the robotic ports.  The robot was undocked from the patient.  The abdomen was then desufflated of CO2 with the aid of five deep breaths from anesthesia.  All trochars were then removed.  All skin incisions were closed using 4-0 Vicryl in a subcuticular fashion and reinforced using surgical skin glue.   Cystoscopy was undertaken at this point. The Foley catheter was removed and the 30 cystoscope was gently introduced through the urethra. The bladder survey was undertaken with efflux of urine from both orifices noted. There were no defects noted in the bladder wall. The cystoscope was removed and the Foley catheter was utilized to fully empty the bladder. The catheter was removed.  The patient tolerated the procedure well.  Sponge, lap, needle, and instrument counts were correct x 2.  VTE prophylaxis: SCDs. Antibiotic prophylaxis: Ancef 2 grams IV prior to the start of the surgery. She was awakened in the operating room and was taken to the PACU in stable condition.   Prentice Docker, MD 12/08/2021 10:09 AM

## 2021-12-08 NOTE — Anesthesia Postprocedure Evaluation (Signed)
Anesthesia Post Note  Patient: Vanessa Barnes  Procedure(s) Performed: XI ROBOTIC ASSISTED TOTAL HYSTERECTOMY WITH BILATERAL SALPINGO OOPHORECTOMY (Bilateral) CYSTOSCOPY  Patient location during evaluation: PACU Anesthesia Type: General Level of consciousness: awake and alert Pain management: pain level controlled Vital Signs Assessment: post-procedure vital signs reviewed and stable Respiratory status: spontaneous breathing, nonlabored ventilation, respiratory function stable and patient connected to nasal cannula oxygen Cardiovascular status: blood pressure returned to baseline and stable Postop Assessment: no apparent nausea or vomiting Anesthetic complications: no   No notable events documented.   Last Vitals:  Vitals:   12/08/21 1100 12/08/21 1121  BP: (!) 117/99 (!) 142/95  Pulse: 89 76  Resp: 13 16  Temp: 36.7 C (!) 36.2 C  SpO2: 99% 100%    Last Pain:  Vitals:   12/08/21 1121  TempSrc: Temporal  PainSc: 4                  Precious Haws Holston Oyama

## 2021-12-08 NOTE — Interval H&P Note (Signed)
History and Physical Interval Note:  12/08/2021 7:34 AM  Vanessa Barnes  has presented today for surgery, with the diagnosis of menorrhagia irregular cycle, chronic pelvic pain.  The various methods of treatment have been discussed with the patient and family. After consideration of risks, benefits and other options for treatment, the patient has consented to  Procedure(s): XI ROBOTIC ASSISTED TOTAL HYSTERECTOMY WITH BILATERAL SALPINGO OOPHORECTOMY (Bilateral) CYSTOSCOPY (N/A) as a surgical intervention.  The patient's history has been reviewed, patient examined, no change in status, stable for surgery.  I have reviewed the patient's chart and labs.  Questions were answered to the patient's satisfaction.  We have extensively reviewed the risk of ovarian removal at the age of 45 years. We further reviewed that if there is too much scar tissue such that the surgery would be risky, I would abandon surgery in favor of having her sent to a tertiary care center where more resources are available. She voiced understanding of this eventuality.   Prentice Docker, MD, [facility] 12/08/2021 7:35 AM

## 2021-12-09 LAB — SURGICAL PATHOLOGY

## 2023-12-26 ENCOUNTER — Other Ambulatory Visit: Payer: Self-pay | Admitting: Physician Assistant

## 2023-12-26 DIAGNOSIS — Z1231 Encounter for screening mammogram for malignant neoplasm of breast: Secondary | ICD-10-CM
